# Patient Record
Sex: Male | Born: 1950 | Race: Black or African American | Hispanic: No | Smoking: Current every day smoker
Health system: Southern US, Community
[De-identification: ages and names within clinical notes are randomized; demographics above are authoritative.]

## PROBLEM LIST (undated history)

## (undated) DIAGNOSIS — I10 Essential (primary) hypertension: Secondary | ICD-10-CM

---

## 2016-10-25 ENCOUNTER — Emergency Department (HOSPITAL_COMMUNITY): Payer: Medicare PPO

## 2016-10-25 ENCOUNTER — Emergency Department (HOSPITAL_COMMUNITY)
Admission: EM | Admit: 2016-10-25 | Discharge: 2016-10-25 | Disposition: A | Discharge status: Home / Self Care | Payer: Medicare PPO | Attending: Emergency Medicine | Admitting: Emergency Medicine

## 2016-10-25 ENCOUNTER — Encounter (HOSPITAL_COMMUNITY): Payer: Self-pay

## 2016-10-25 DIAGNOSIS — F17210 Nicotine dependence, cigarettes, uncomplicated: Secondary | ICD-10-CM | POA: Diagnosis not present

## 2016-10-25 DIAGNOSIS — L03113 Cellulitis of right upper limb: Secondary | ICD-10-CM | POA: Diagnosis not present

## 2016-10-25 DIAGNOSIS — M7989 Other specified soft tissue disorders: Secondary | ICD-10-CM | POA: Diagnosis not present

## 2016-10-25 DIAGNOSIS — L03119 Cellulitis of unspecified part of limb: Secondary | ICD-10-CM

## 2016-10-25 DIAGNOSIS — M79641 Pain in right hand: Secondary | ICD-10-CM | POA: Diagnosis not present

## 2016-10-25 DIAGNOSIS — I10 Essential (primary) hypertension: Secondary | ICD-10-CM | POA: Insufficient documentation

## 2016-10-25 HISTORY — DX: Essential (primary) hypertension: I10

## 2016-10-25 MED ORDER — ONDANSETRON HCL 4 MG PO TABS
4.0000 mg | ORAL_TABLET | Freq: Once | ORAL | Status: AC
  Administered 2016-10-25: 4 mg via ORAL
  Filled 2016-10-25: qty 1

## 2016-10-25 MED ORDER — KETOROLAC TROMETHAMINE 10 MG PO TABS
10.0000 mg | ORAL_TABLET | Freq: Once | ORAL | Status: AC
  Administered 2016-10-25: 10 mg via ORAL
  Filled 2016-10-25: qty 1

## 2016-10-25 MED ORDER — AMOXICILLIN-POT CLAVULANATE 875-125 MG PO TABS
1.0000 | ORAL_TABLET | Freq: Once | ORAL | Status: AC
  Administered 2016-10-25: 1 via ORAL
  Filled 2016-10-25: qty 1

## 2016-10-25 MED ORDER — AMOXICILLIN-POT CLAVULANATE 875-125 MG PO TABS: 1.0000 | ORAL_TABLET | Freq: Two times a day (BID) | ORAL | 0 refills | Status: DC

## 2016-10-25 NOTE — Discharge Instructions (Signed)
Your blood pressure is elevated at 165/112, otherwise your vital signs are okay. Your x-ray is negative for broken bone, foreign body, or other acute problem. I suspect that you have an infection in your hand. Please use Augmentin 2 times daily with food. Use 600 mg of ibuprofen with breakfast, lunch, and dinner. Please return to the emergency department on January 15 for recheck of your hand.

## 2016-10-25 NOTE — ED Triage Notes (Signed)
Pt reports right hand started swelling across knuckles on Monday. Now entire hand is swollen. Complaining of pain. Unable to bend fingers

## 2016-10-26 NOTE — ED Provider Notes (Signed)
[facility] Provider Note   CSN: [id number] Arrival date & time: 10/25/16  1006     History   Chief Complaint Chief Complaint  Patient presents with  . Hand Pain    HPI Hector Nguyen is a 66 y.o. male.  Patient is a 66 year old male who presents to the emergency department with a complaint of hand pain and swelling.  The patient states that 2 days ago he was helping someone to clean out dose. He states that after doing so he went home, had a few beers, laid down on the couch and fell asleep. When he awakened he felt a pain and throbbing with some mild swelling of his right hand. He thought that perhaps he had been bitten by something. He applied a cool compress to the hand, but the following day the pain and swelling worse. He presents today because the problem seems to be getting worse instead of better. The hand is warm to touch and he is concerned about infection. He can use his right upper extremity, but has some stiffness and some pain with movement of the fingers and wrist. His been no fever or chills reported. No drainage from the hand. The patient clearly denies being in any recent fight or confrontation. He denies hitting anyone in the mouth, or having contact with his hand with any persons teeth.   The history is provided by the patient.  Hand Pain  This is a new problem. Pertinent negatives include no chest pain, no abdominal pain and no shortness of breath.    Past Medical History:  Diagnosis Date  . Hypertension     There are no active problems to display for this patient.   History reviewed. No pertinent surgical history.     Home Medications    Prior to Admission medications   Medication Sig Start Date End Date Taking? Authorizing Provider  ibuprofen (ADVIL,MOTRIN) 200 MG tablet Take 400 mg by mouth every 6 (six) hours as needed for moderate pain.   Yes Historical Provider, MD  amoxicillin-clavulanate (AUGMENTIN) 875-125 MG tablet Take 1 tablet  by mouth every 12 (twelve) hours. 10/25/16   Ivery Quale, PA-C    Family History No family history on file.  Social History Social History  Substance Use Topics  . Smoking status: Current Every Day Smoker    Packs/day: 0.50    Types: Cigarettes  . Smokeless tobacco: Never Used  . Alcohol use 3.6 oz/week    6 Cans of beer per week     Allergies   Patient has no known allergies.   Review of Systems Review of Systems  Constitutional: Negative for activity change.       All ROS Neg except as noted in HPI  HENT: Negative for nosebleeds.   Eyes: Negative for photophobia and discharge.  Respiratory: Negative for cough, shortness of breath and wheezing.   Cardiovascular: Negative for chest pain and palpitations.  Gastrointestinal: Negative for abdominal pain and blood in stool.  Genitourinary: Negative for dysuria, frequency and hematuria.  Musculoskeletal: Negative for arthralgias, back pain and neck pain.  Skin: Negative.   Neurological: Negative for dizziness, seizures and speech difficulty.  Psychiatric/Behavioral: Negative for confusion and hallucinations.     Physical Exam Updated Vital Signs BP (!) 168/104 (BP Location: Right Arm)   Pulse 76   Temp 98.1 F (36.7 C) (Oral)   Resp 16   Ht 6\' 3"  (1.905 m)   Wt 90.7 kg   SpO2 100%  BMI 25.00 kg/m   Physical Exam  Constitutional: He is oriented to person, place, and time. He appears well-developed and well-nourished.  Non-toxic appearance.  HENT:  Head: Normocephalic.  Right Ear: Tympanic membrane and external ear normal.  Left Ear: Tympanic membrane and external ear normal.  Eyes: EOM and lids are normal. Pupils are equal, round, and reactive to light.  Neck: Normal range of motion. Neck supple. Carotid bruit is not present.  Cardiovascular: Normal rate, regular rhythm, normal heart sounds, intact distal pulses and normal pulses.   Pulmonary/Chest: Breath sounds normal. No respiratory distress.  Abdominal:  Soft. Bowel sounds are normal. There is no tenderness. There is no guarding.  Musculoskeletal: Normal range of motion.  There is full range of motion of the right shoulder and elbow. There is good flexion and pronation with the right wrist. There is swelling of the dorsum of the right hand. There is a scab area just behind the MP joint of the long finger. There is good range of motion of the fingers, but with some stiffness present. Capillary refill is less than 2 seconds. Dorsalis pedis pulses 2+.  Lymphadenopathy:       Head (right side): No submandibular adenopathy present.       Head (left side): No submandibular adenopathy present.    He has no cervical adenopathy.  Neurological: He is alert and oriented to person, place, and time. He has normal strength. No cranial nerve deficit or sensory deficit.  Skin: Skin is warm and dry.  Psychiatric: He has a normal mood and affect. His speech is normal.  Nursing note and vitals reviewed.    ED Treatments / Results  Labs (all labs ordered are listed, but only abnormal results are displayed) Labs Reviewed - No data to display  EKG  EKG Interpretation None       Radiology Dg Hand Complete Right  Result Date: 10/25/2016 CLINICAL DATA:  PAIN AND SWELLING TO RIGHT HAND, EVAL FOR FB OR GAS AT DORSUM OF THE HAND, PT STATES HE WAS HELPING SOMEONE CLEAN OUT A HOUSE ON Monday, WENT HOME, HAD A FEW BEERS, LAID DOWN ON THE COUCH AND FELL ASLEEP AND WAS WOKEN UP BY PAIN, THROBBING, AND SWELLING IN HIS RIGHT HAND, NO KNOWN SPECIFIC INJURY OR BITE EXAM: RIGHT HAND - COMPLETE 3+ VIEW COMPARISON:  None. FINDINGS: No fracture.  No bone lesion. There is asymmetric joint space narrowing of the third metacarpal phalangeal joint with small marginal spurs. Remaining joints are normally spaced and aligned. There is dorsal soft tissue swelling. No radiopaque foreign body. No soft tissue air. IMPRESSION: 1. No fracture or dislocation.  No bone lesion. 2. Dorsal soft  tissue swelling with no radiopaque foreign body or soft tissue air. 3. Arthropathic changes of the third metacarpal phalangeal joint. Electronically Signed   By: Amie Portland M.D.   On: 10/25/2016 12:13    Procedures Procedures (including critical care time)  Medications Ordered in ED Medications  amoxicillin-clavulanate (AUGMENTIN) 875-125 MG per tablet 1 tablet (1 tablet Oral Given 10/25/16 1304)  ketorolac (TORADOL) tablet 10 mg (10 mg Oral Given 10/25/16 1304)  ondansetron (ZOFRAN) tablet 4 mg (4 mg Oral Given 10/25/16 1304)     Initial Impression / Assessment and Plan / ED Course  I have reviewed the triage vital signs and the nursing notes.  Pertinent labs & imaging results that were available during my care of the patient were reviewed by me and considered in my medical decision making (see chart  for details).  Clinical Course     **I have reviewed nursing notes, vital signs, and all appropriate lab and imaging results for this patient.*  Final Clinical Impressions(s) / ED Diagnoses  Pressure is elevated at 168/104. The remainder the vital signs within normal limits. There no red streaks going up the arm. There is no drainage from the hand. There is full range of motion of the entire right upper extremity. X-ray of the right hand shows no fracture or dislocation. There is no radiopaque foreign body appreciated. There is noted soft tissue swelling of the dorsum of the hand.  The patient will be treated with Augmentin and anti-inflammatory medication. The patient is to return to the emergency department in 3 days for recheck of his hand. Sooner if any acute or emergent changes or problems.     Final diagnoses:  Cellulitis of hand    New Prescriptions Discharge Medication List as of 10/25/2016  1:13 PM    START taking these medications   Details  amoxicillin-clavulanate (AUGMENTIN) 875-125 MG tablet Take 1 tablet by mouth every 12 (twelve) hours., Starting Fri 10/25/2016,  Print         [place] Michaelene Dutan, PA-C 10/26/16 1603    Bethann BerkshireJoseph Zammit, MD 11/04/16 [phone]

## 2016-11-06 ENCOUNTER — Encounter (HOSPITAL_COMMUNITY): Payer: Self-pay | Admitting: Emergency Medicine

## 2016-11-06 ENCOUNTER — Emergency Department (HOSPITAL_COMMUNITY)
Admission: EM | Admit: 2016-11-06 | Discharge: 2016-11-06 | Disposition: A | Discharge status: Home / Self Care | Payer: Medicare PPO | Attending: Emergency Medicine | Admitting: Emergency Medicine

## 2016-11-06 ENCOUNTER — Emergency Department (HOSPITAL_COMMUNITY): Payer: Medicare PPO

## 2016-11-06 DIAGNOSIS — F17210 Nicotine dependence, cigarettes, uncomplicated: Secondary | ICD-10-CM | POA: Diagnosis not present

## 2016-11-06 DIAGNOSIS — Z79899 Other long term (current) drug therapy: Secondary | ICD-10-CM | POA: Insufficient documentation

## 2016-11-06 DIAGNOSIS — I10 Essential (primary) hypertension: Secondary | ICD-10-CM | POA: Diagnosis not present

## 2016-11-06 DIAGNOSIS — M7989 Other specified soft tissue disorders: Secondary | ICD-10-CM | POA: Diagnosis not present

## 2016-11-06 DIAGNOSIS — R2231 Localized swelling, mass and lump, right upper limb: Secondary | ICD-10-CM | POA: Diagnosis not present

## 2016-11-06 LAB — COMPREHENSIVE METABOLIC PANEL
ALBUMIN: 3.7 g/dL (ref 3.5–5.0)
ALT: 12 U/L — ABNORMAL LOW (ref 17–63)
AST: 13 U/L — AB (ref 15–41)
Alkaline Phosphatase: 44 U/L (ref 38–126)
Anion gap: 7 (ref 5–15)
BUN: 11 mg/dL (ref 6–20)
CO2: 25 mmol/L (ref 22–32)
CREATININE: 0.9 mg/dL (ref 0.61–1.24)
Calcium: 8.9 mg/dL (ref 8.9–10.3)
Chloride: 107 mmol/L (ref 101–111)
GFR calc Af Amer: >60 mL/min (ref >60)
GLUCOSE: 123 mg/dL — AB (ref 65–99)
POTASSIUM: 3.4 mmol/L — AB (ref 3.5–5.1)
Sodium: 139 mmol/L (ref 135–145)
TOTAL PROTEIN: 7.1 g/dL (ref 6.5–8.1)
Total Bilirubin: 0.5 mg/dL (ref 0.3–1.2)

## 2016-11-06 LAB — CBC WITH DIFFERENTIAL/PLATELET
BASOS ABS: 0.1 10*3/uL (ref 0.0–0.1)
Basophils Relative: 1 %
Eosinophils Absolute: 0.1 10*3/uL (ref 0.0–0.7)
Eosinophils Relative: 1 %
HCT: 38.8 % — ABNORMAL LOW (ref 39.0–52.0)
HEMOGLOBIN: 13.2 g/dL (ref 13.0–17.0)
LYMPHS PCT: 28 %
Lymphs Abs: 1.8 10*3/uL (ref 0.7–4.0)
MCH: 29.1 pg (ref 26.0–34.0)
MCHC: 34 g/dL (ref 30.0–36.0)
MCV: 85.5 fL (ref 78.0–100.0)
MONO ABS: 0.5 10*3/uL (ref 0.1–1.0)
MONOS PCT: 8 %
NEUTROS ABS: 4.1 10*3/uL (ref 1.7–7.7)
NEUTROS PCT: 62 %
Platelets: 410 10*3/uL — ABNORMAL HIGH (ref 150–400)
RBC: 4.54 MIL/uL (ref 4.22–5.81)
RDW: 14.2 % (ref 11.5–15.5)
WBC: 6.6 10*3/uL (ref 4.0–10.5)

## 2016-11-06 MED ORDER — HYDROCODONE-ACETAMINOPHEN 5-325 MG PO TABS
1.0000 | ORAL_TABLET | Freq: Once | ORAL | Status: AC
  Administered 2016-11-06: 1 via ORAL
  Filled 2016-11-06: qty 1

## 2016-11-06 MED ORDER — ACETAMINOPHEN 325 MG PO TABS
650.0000 mg | ORAL_TABLET | Freq: Once | ORAL | Status: AC
  Administered 2016-11-06: 650 mg via ORAL
  Filled 2016-11-06: qty 2

## 2016-11-06 NOTE — ED Triage Notes (Signed)
PT c/o right hand pain/swelling unrelieved by ibuprofen and antibiotics that were started on 10/25/16. PT denies any new injuries or fevers.

## 2016-11-06 NOTE — ED Provider Notes (Signed)
[facility] Provider Note   CSN: [id number] Arrival date & time: 11/06/16  8295   By signing my name below, I, Cynda Acres, attest that this documentation has been prepared under the direction and in the presence of Azalia Bilis, MD. Electronically Signed: Cynda Acres, Scribe. 11/06/16. 9:57 AM.   History   Chief Complaint Chief Complaint  Patient presents with  . Cellulitis    HPI Comments: Hector Nguyen is a 66 y.o. male with a hx of HTN, who presents to the Emergency Department complaining of right hand pain that began 2 weeks ago. Patient was here one week ago, in which he was prescribed Augmentin. Patient has associated swelling and elbow pain (no longer present). Patient states he has had gout in the past. Patient has had no improvement with Augmentin and advil (last pill taken today). Patient denies any fevers, injury, elevation, or recent blood clots.   The history is provided by the patient. No language interpreter was used.    Past Medical History:  Diagnosis Date  . Hypertension     There are no active problems to display for this patient.   History reviewed. No pertinent surgical history.     Home Medications    Prior to Admission medications   Medication Sig Start Date End Date Taking? Authorizing Provider  amoxicillin-clavulanate (AUGMENTIN) 875-125 MG tablet Take 1 tablet by mouth every 12 (twelve) hours. 10/25/16   Ivery Quale, PA-C  ibuprofen (ADVIL,MOTRIN) 200 MG tablet Take 400 mg by mouth every 6 (six) hours as needed for moderate pain.    Historical Provider, MD    Family History History reviewed. No pertinent family history.  Social History Social History  Substance Use Topics  . Smoking status: Current Every Day Smoker    Packs/day: 0.50    Types: Cigarettes  . Smokeless tobacco: Never Used  . Alcohol use 3.6 oz/week    6 Cans of beer per week     Allergies   Patient has no known allergies.   Review of  Systems Review of Systems A complete 10 system review of systems was obtained and all systems are negative except as noted in the HPI and PMH.    Physical Exam Updated Vital Signs BP (!) 183/106 (BP Location: Left Arm)   Pulse 70   Temp 98.5 F (36.9 C) (Oral)   Resp 18   Ht 6\' 3"  (1.905 m)   Wt 200 lb (90.7 kg)   SpO2 100%   BMI 25.00 kg/m   Physical Exam  Constitutional: He is oriented to person, place, and time. He appears well-developed and well-nourished.  HENT:  Head: Normocephalic.  Eyes: EOM are normal.  Neck: Normal range of motion.  Pulmonary/Chest: Effort normal.  Abdominal: He exhibits no distension.  Musculoskeletal:  Swelling to the dorsum the right hand.  Normal flexion and extension of all 5 fingers on the right hand.  Normal right radial pulse.  Compartments soft.  No erythema or warmth of the dorsum of the right hand.  No obvious injury or trauma pattern noted.  Normal range of motion of the right elbow and right shoulder.  No significant tenderness in the right medial upper arm.  Neurological: He is alert and oriented to person, place, and time.  Psychiatric: He has a normal mood and affect.  Nursing note and vitals reviewed.    ED Treatments / Results  DIAGNOSTIC STUDIES: Oxygen Saturation is 100% on RA, normal by my interpretation.    COORDINATION OF  CARE: 9:56 AM Discussed treatment plan with pt at bedside and pt agreed to plan.  Labs (all labs ordered are listed, but only abnormal results are displayed) Labs Reviewed  CBC WITH DIFFERENTIAL/PLATELET - Abnormal; Notable for the following:       Result Value   HCT 38.8 (*)    Platelets 410 (*)    All other components within normal limits  COMPREHENSIVE METABOLIC PANEL - Abnormal; Notable for the following:    Potassium 3.4 (*)    Glucose, Bld 123 (*)    AST 13 (*)    ALT 12 (*)    All other components within normal limits    EKG  EKG Interpretation None       Radiology [place] Venous Img  Upper Uni Right  Result Date: 11/06/2016 CLINICAL DATA:  Right hand swelling. EXAM: RIGHT UPPER EXTREMITY VENOUS DOPPLER ULTRASOUND TECHNIQUE: Gray-scale sonography with graded compression, as well as color Doppler and duplex ultrasound were performed to evaluate the upper extremity deep venous system from the level of the subclavian vein and including the jugular, axillary, basilic, radial, ulnar and upper cephalic vein. Spectral Doppler was utilized to evaluate flow at rest and with distal augmentation maneuvers. COMPARISON:  None. FINDINGS: Contralateral Subclavian Vein: Normal compression, color Doppler flow and phasicity in the left subclavian vein. Internal Jugular Vein: No thrombus. Normal compressibility, color Doppler flow and phasicity. Subclavian Vein: Normal compressibility, color Doppler flow and phasicity. Axillary Vein: No thrombus. Normal compressibility, color Doppler flow and augmentation. Cephalic Vein: No thrombus. Normal compressibility and color Doppler flow. Basilic Vein: No thrombus. Normal compressibility, color Doppler flow and augmentation. Brachial Veins: No thrombus. Normal compressibility, color Doppler flow and augmentation in the brachial veins. Radial Veins: No thrombus. Normal compressibility and color Doppler flow. Ulnar Veins: No thrombus. Normal compressibility and color Doppler flow. IMPRESSION: No evidence of deep venous thrombosis in the right upper extremity. Electronically Signed   By: Richarda OverlieAdam  Henn M.D.   On: 11/06/2016 11:26    Procedures Procedures (including critical care time)  Medications Ordered in ED Medications - No data to display   Initial Impression / Assessment and Plan / ED Course  I have reviewed the triage vital signs and the nursing notes.  Pertinent labs & imaging results that were available during my care of the patient were reviewed by me and considered in my medical decision making (see chart for details).     Patient has a history of  gout and this may represent gouty arthritis.  He does have some pain with range of motion of his MCP joints.  There is no obvious erythema or warmth.  I'm not convinced this is infection.  I think he's had his hand down at his side the majority of the time and this is definitely not helping the swelling.  Venous duplex demonstrates no evidence of DVT.  Recommended elevation as well as primary care and orthopedic follow-up.  I've put him in a sling in that right arm with his hand elevated above the level of his heart to see if this helps with swelling.  Final Clinical Impressions(s) / ED Diagnoses   Final diagnoses:  Swelling of right hand    New Prescriptions New Prescriptions   No medications on file   I personally performed the services described in this documentation, which was scribed in my presence. The recorded information has been reviewed and is accurate.        Azalia BilisKevin Mikaili Flippin, MD 11/06/16 [phone]

## 2016-11-06 NOTE — ED Notes (Signed)
Pt back from US

## 2016-11-06 NOTE — Discharge Instructions (Signed)
Elevate your hand.   Please call your primary care physician and the orthopedist for follow up  Return to ER for new or worsening symptoms

## 2016-12-26 DIAGNOSIS — F1020 Alcohol dependence, uncomplicated: Secondary | ICD-10-CM | POA: Diagnosis not present

## 2016-12-26 DIAGNOSIS — I10 Essential (primary) hypertension: Secondary | ICD-10-CM | POA: Diagnosis not present

## 2017-01-17 DIAGNOSIS — M1000 Idiopathic gout, unspecified site: Secondary | ICD-10-CM | POA: Diagnosis not present

## 2017-01-17 DIAGNOSIS — I10 Essential (primary) hypertension: Secondary | ICD-10-CM | POA: Diagnosis not present

## 2017-01-17 DIAGNOSIS — F1020 Alcohol dependence, uncomplicated: Secondary | ICD-10-CM | POA: Diagnosis not present

## 2017-01-17 DIAGNOSIS — Z6823 Body mass index (BMI) 23.0-23.9, adult: Secondary | ICD-10-CM | POA: Diagnosis not present

## 2017-01-17 DIAGNOSIS — D649 Anemia, unspecified: Secondary | ICD-10-CM | POA: Diagnosis not present

## 2017-02-18 DIAGNOSIS — F1020 Alcohol dependence, uncomplicated: Secondary | ICD-10-CM | POA: Diagnosis not present

## 2017-02-18 DIAGNOSIS — M1000 Idiopathic gout, unspecified site: Secondary | ICD-10-CM | POA: Diagnosis not present

## 2017-02-18 DIAGNOSIS — I10 Essential (primary) hypertension: Secondary | ICD-10-CM | POA: Diagnosis not present

## 2017-02-18 DIAGNOSIS — D649 Anemia, unspecified: Secondary | ICD-10-CM | POA: Diagnosis not present

## 2017-03-25 DIAGNOSIS — D644 Congenital dyserythropoietic anemia: Secondary | ICD-10-CM | POA: Diagnosis not present

## 2017-03-25 DIAGNOSIS — D649 Anemia, unspecified: Secondary | ICD-10-CM | POA: Diagnosis not present

## 2017-03-25 DIAGNOSIS — I10 Essential (primary) hypertension: Secondary | ICD-10-CM | POA: Diagnosis not present

## 2017-03-25 DIAGNOSIS — E785 Hyperlipidemia, unspecified: Secondary | ICD-10-CM | POA: Diagnosis not present

## 2017-03-25 DIAGNOSIS — M1000 Idiopathic gout, unspecified site: Secondary | ICD-10-CM | POA: Diagnosis not present

## 2017-03-25 DIAGNOSIS — F1020 Alcohol dependence, uncomplicated: Secondary | ICD-10-CM | POA: Diagnosis not present

## 2017-03-30 IMAGING — US US EXTREM  UP VENOUS*R*
1 series · 13 of 24 positions shown · non-contrast
Comparison: None.

CLINICAL DATA: Right hand swelling.



[Series 1: us extrem up venous*right* · 0.08mm/px · 13 of 63 slices shown]
[im 1/63]
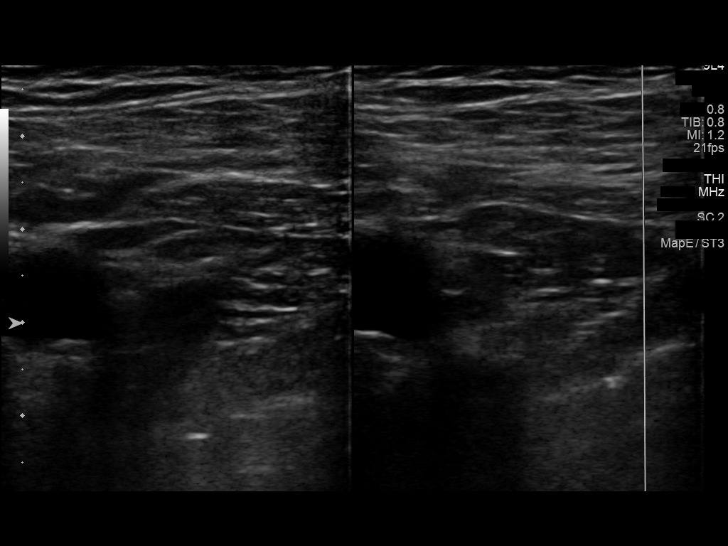
[im 6/63]
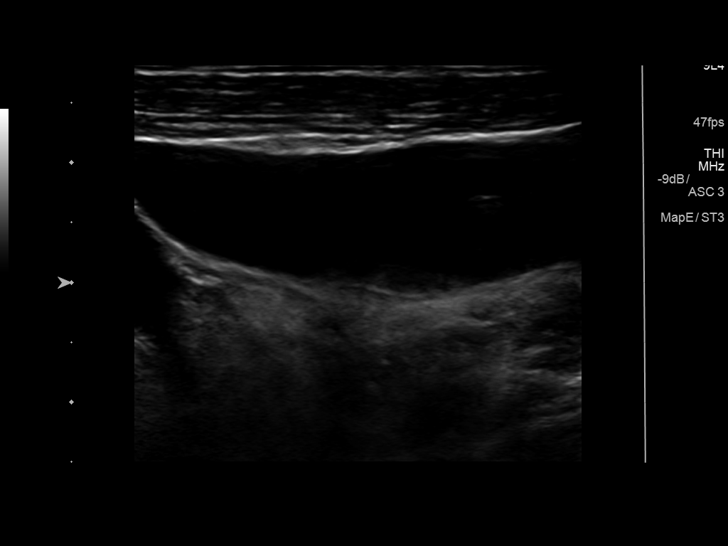
[im 11/63]
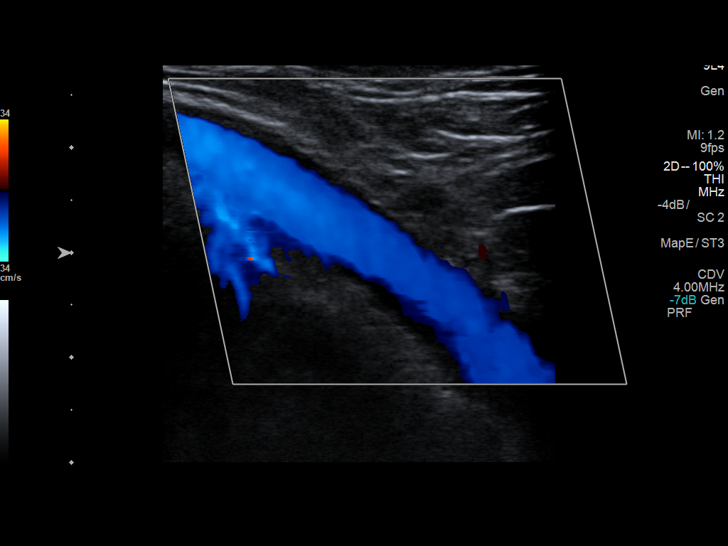
[im 17/63]
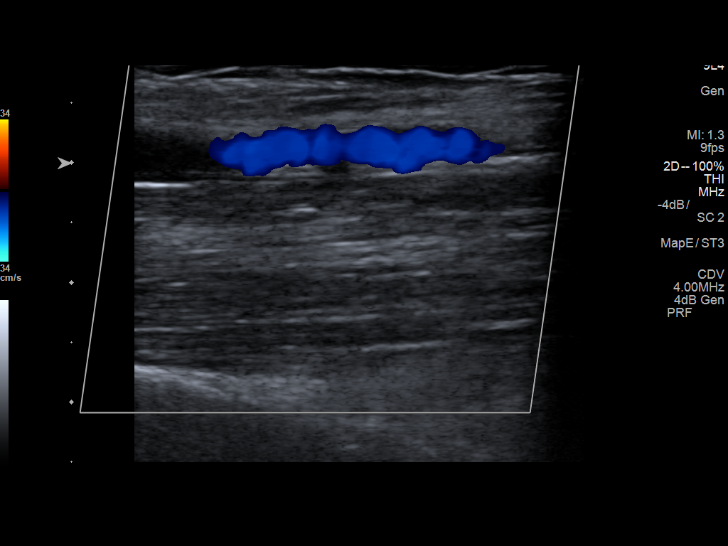
[im 22/63]
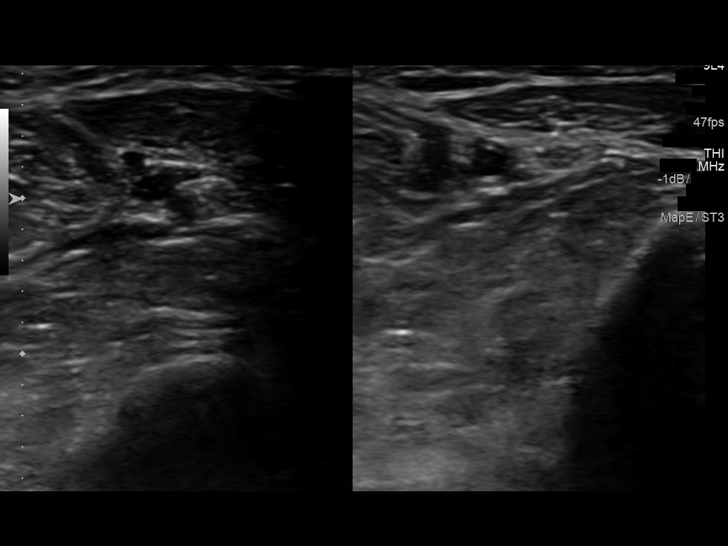
[im 27/63]
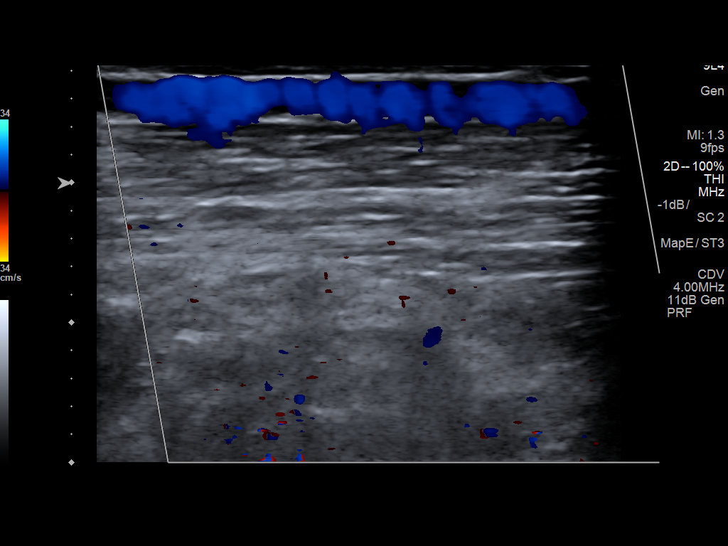
[im 33/63]
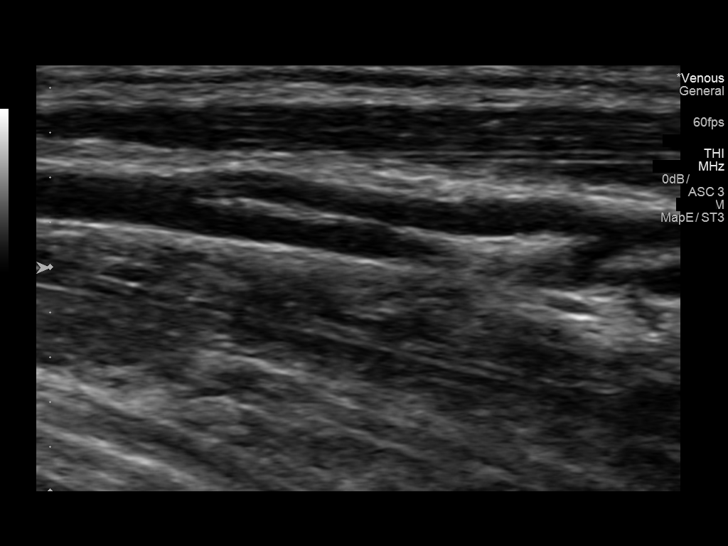
[im 36/63]
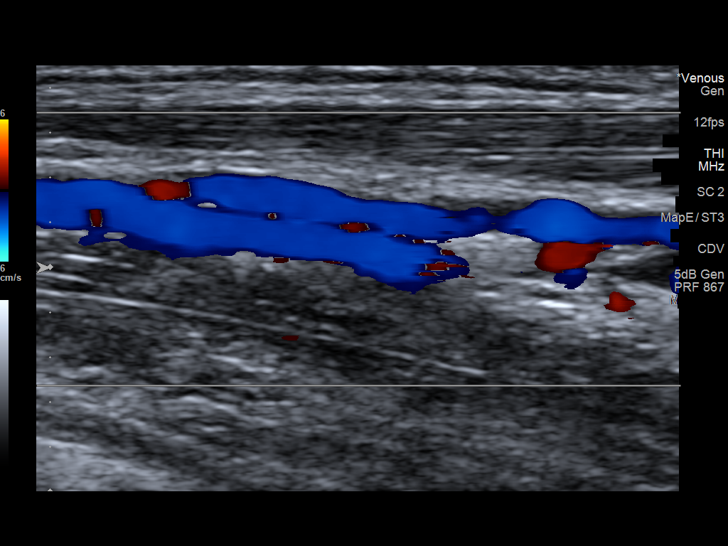
[im 41/63]
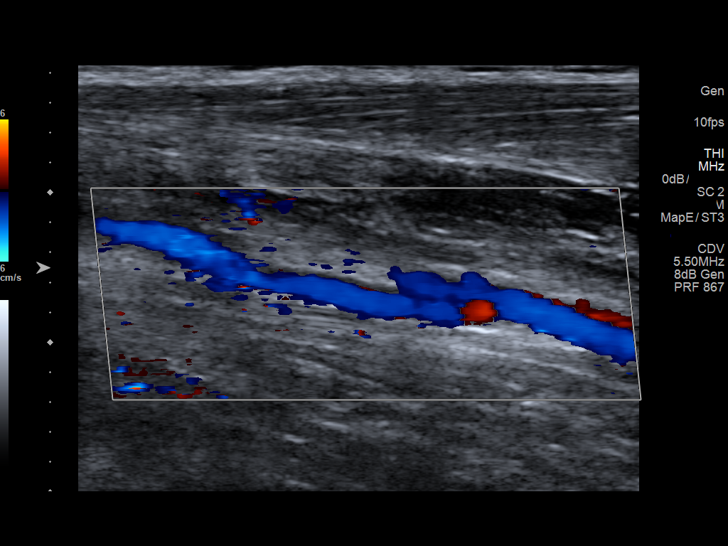
[im 46/63]
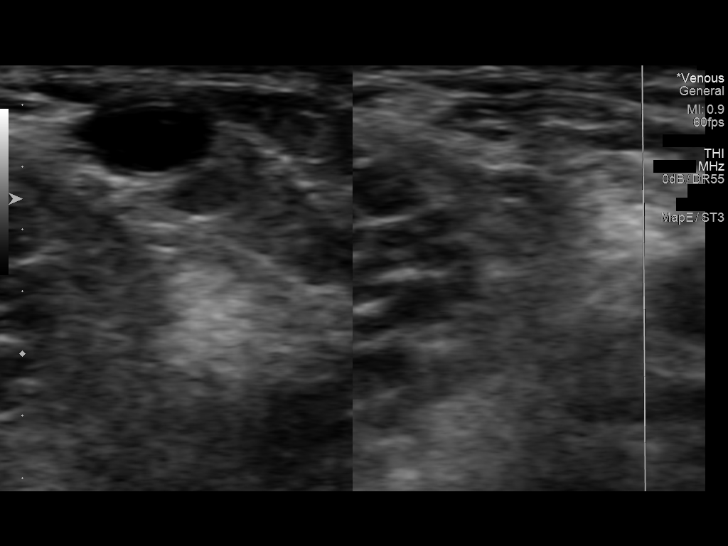
[im 52/63]
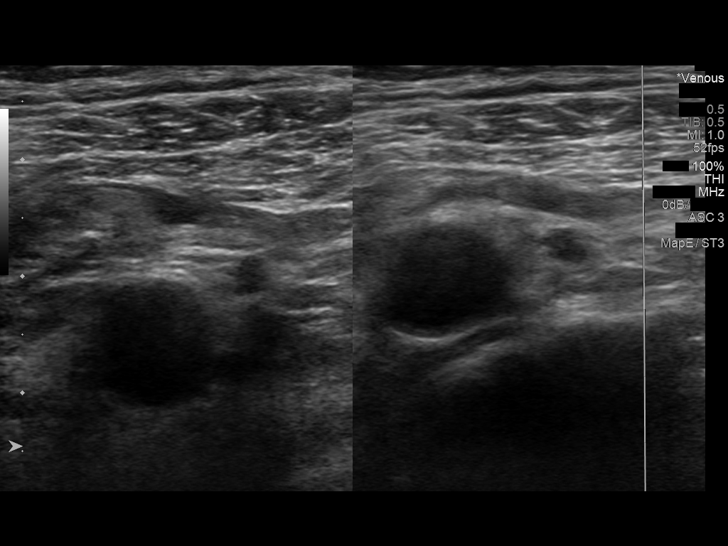
[im 57/63]
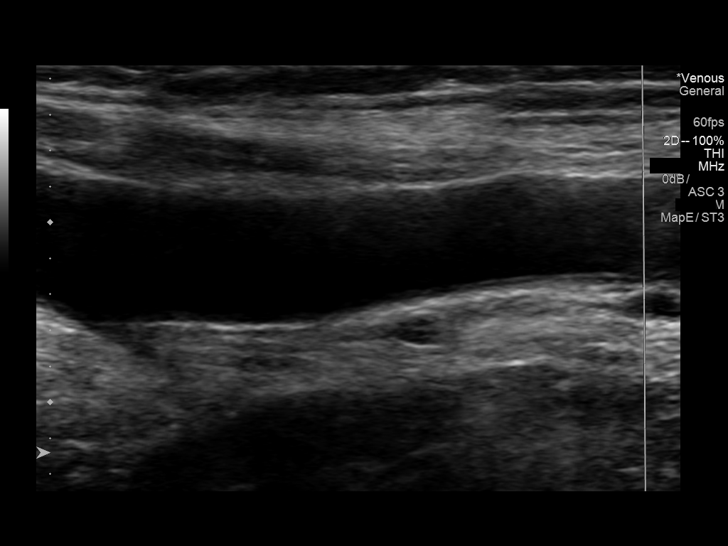
[im 63/63]
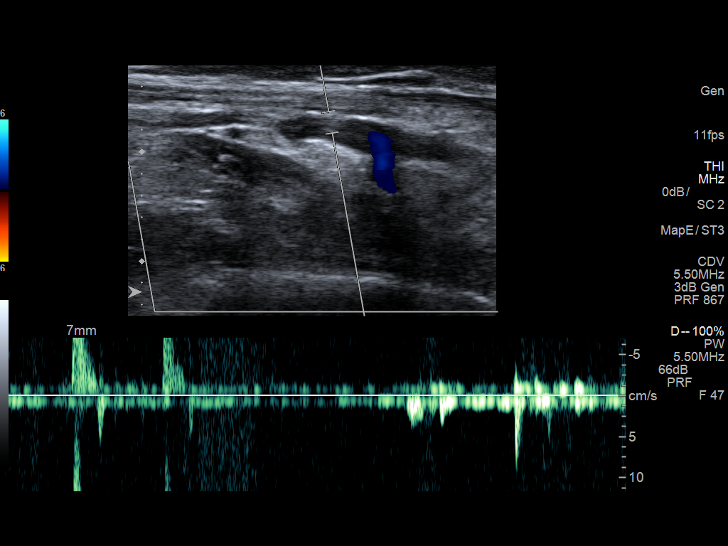

[13 of 24 positions shown; findings below may reference images not displayed]

FINDINGS: Contralateral Subclavian Vein: Normal compression, color Doppler
flow and phasicity in the left subclavian vein.

Internal Jugular Vein: No thrombus. Normal compressibility, color
Doppler flow and phasicity.

Subclavian Vein: Normal compressibility, color Doppler flow and
phasicity.

Axillary Vein: No thrombus. Normal compressibility, color Doppler
flow and augmentation.

Cephalic Vein: No thrombus. Normal compressibility and color Doppler
flow.

Basilic Vein: No thrombus. Normal compressibility, color Doppler
flow and augmentation.

Brachial Veins: No thrombus. Normal compressibility, color Doppler
flow and augmentation in the brachial veins.

Radial Veins: No thrombus. Normal compressibility and color Doppler
flow.

Ulnar Veins: No thrombus. Normal compressibility and color Doppler
flow.
IMPRESSION: No evidence of deep venous thrombosis in the right upper extremity.

## 2017-06-11 DIAGNOSIS — M1000 Idiopathic gout, unspecified site: Secondary | ICD-10-CM | POA: Diagnosis not present

## 2017-06-11 DIAGNOSIS — F1020 Alcohol dependence, uncomplicated: Secondary | ICD-10-CM | POA: Diagnosis not present

## 2017-06-11 DIAGNOSIS — Z0000 Encounter for general adult medical examination without abnormal findings: Secondary | ICD-10-CM | POA: Diagnosis not present

## 2017-06-11 DIAGNOSIS — D649 Anemia, unspecified: Secondary | ICD-10-CM | POA: Diagnosis not present

## 2017-06-11 DIAGNOSIS — I10 Essential (primary) hypertension: Secondary | ICD-10-CM | POA: Diagnosis not present

## 2017-06-17 ENCOUNTER — Other Ambulatory Visit: Payer: Self-pay | Admitting: Pharmacy Technician

## 2017-06-17 NOTE — Patient Outreach (Signed)
[facility] Care Management  06/17/2017  Hector QualeRobert A Nguyen 05/01/1951 [id number]  Contacted [facility] to verify last fill for Losartan/HCTZ 100-12.5mg  medication adherence. [facility] that patient last filled script on 06/11/17 for 30 day supply.  Suzan SlickAshley N. Ernesta Ambleoleman, CPhT [facility] Care Management [phone]

## 2017-07-25 DIAGNOSIS — M1000 Idiopathic gout, unspecified site: Secondary | ICD-10-CM | POA: Diagnosis not present

## 2017-07-25 DIAGNOSIS — F1020 Alcohol dependence, uncomplicated: Secondary | ICD-10-CM | POA: Diagnosis not present

## 2017-07-25 DIAGNOSIS — I10 Essential (primary) hypertension: Secondary | ICD-10-CM | POA: Diagnosis not present

## 2017-07-25 DIAGNOSIS — D649 Anemia, unspecified: Secondary | ICD-10-CM | POA: Diagnosis not present

## 2017-10-24 DIAGNOSIS — M1000 Idiopathic gout, unspecified site: Secondary | ICD-10-CM | POA: Diagnosis not present

## 2017-10-24 DIAGNOSIS — D649 Anemia, unspecified: Secondary | ICD-10-CM | POA: Diagnosis not present

## 2017-10-24 DIAGNOSIS — F1020 Alcohol dependence, uncomplicated: Secondary | ICD-10-CM | POA: Diagnosis not present

## 2017-10-24 DIAGNOSIS — I10 Essential (primary) hypertension: Secondary | ICD-10-CM | POA: Diagnosis not present

## 2023-10-16 ENCOUNTER — Other Ambulatory Visit: Payer: Self-pay

## 2023-10-16 ENCOUNTER — Emergency Department (HOSPITAL_COMMUNITY)
Admission: EM | Admit: 2023-10-16 | Discharge: 2023-10-17 | Disposition: A | Discharge status: Home / Self Care | Payer: Medicare Other | Attending: Emergency Medicine | Admitting: Emergency Medicine

## 2023-10-16 ENCOUNTER — Encounter (HOSPITAL_COMMUNITY): Payer: Self-pay

## 2023-10-16 DIAGNOSIS — I10 Essential (primary) hypertension: Secondary | ICD-10-CM | POA: Insufficient documentation

## 2023-10-16 DIAGNOSIS — S0990XA Unspecified injury of head, initial encounter: Secondary | ICD-10-CM | POA: Diagnosis present

## 2023-10-16 DIAGNOSIS — S01111A Laceration without foreign body of right eyelid and periocular area, initial encounter: Secondary | ICD-10-CM | POA: Diagnosis not present

## 2023-10-16 DIAGNOSIS — S0083XA Contusion of other part of head, initial encounter: Secondary | ICD-10-CM

## 2023-10-16 DIAGNOSIS — W010XXA Fall on same level from slipping, tripping and stumbling without subsequent striking against object, initial encounter: Secondary | ICD-10-CM | POA: Diagnosis not present

## 2023-10-16 NOTE — ED Triage Notes (Signed)
 Pt arrived POV with family for fall after stumbling over an object. Pt fell face forward from standing position and hit his face on hardwood floors. No LOC, not on thinners, on takes BP meds. Pt admits to ETOH tonight, drank half a pint of liquor. Pt has hematoma to left side fore head above left eyebrow, and laceration above the right eyebrow. Pt currently A&O x4, HTN noted, otherwise VSS.

## 2023-10-17 ENCOUNTER — Emergency Department (HOSPITAL_COMMUNITY): Payer: Medicare Other

## 2023-10-17 NOTE — ED Provider Notes (Signed)
 [place] [facility] AT [facility] Provider Note   CSN: [id number] Arrival date & time: 10/16/23  2316     History  Chief Complaint  Patient presents with   Hector Nguyen is a 73 y.o. male.  Patient presents to the emergency department for evaluation after a fall.  Patient tripped over an object and fell to the ground, hitting his head on the hardwood floor.  No loss of consciousness.       Home Medications Prior to Admission medications   Medication Sig Start Date End Date Taking? Authorizing Provider  ibuprofen (ADVIL,MOTRIN) 200 MG tablet Take 200-400 mg by mouth every 6 (six) hours as needed for moderate pain.     [provider]      Allergies    Patient has no known allergies.    Review of Systems   Review of Systems  Physical Exam Updated Vital Signs BP (!) 173/92 (BP Location: Right Arm)   Pulse 82   Temp 98.1 F (36.7 C) (Oral)   Resp 20   Ht 6' 3 (1.905 m)   Wt 81.6 kg   SpO2 100%   BMI 22.50 kg/m  Physical Exam Vitals and nursing note reviewed.  Constitutional:      General: He is not in acute distress.    Appearance: He is well-developed.  HENT:     Head: Normocephalic and atraumatic.      Mouth/Throat:     Mouth: Mucous membranes are moist.  Eyes:     General: Vision grossly intact. Gaze aligned appropriately.     Extraocular Movements: Extraocular movements intact.     Conjunctiva/sclera: Conjunctivae normal.     Right eye: Right conjunctiva is not injected. No hemorrhage. Cardiovascular:     Rate and Rhythm: Normal rate and regular rhythm.     Pulses: Normal pulses.     Heart sounds: Normal heart sounds, S1 normal and S2 normal. No murmur heard.    No friction rub. No gallop.  Pulmonary:     Effort: Pulmonary effort is normal. No respiratory distress.     Breath sounds: Normal breath sounds.  Abdominal:     Palpations: Abdomen is soft.     Tenderness: There is no abdominal tenderness.  There is no guarding or rebound.     Hernia: No hernia is present.  Musculoskeletal:        General: No swelling.     Cervical back: Full passive range of motion without pain, normal range of motion and neck supple. No pain with movement, spinous process tenderness or muscular tenderness. Normal range of motion.     Right lower leg: No edema.     Left lower leg: No edema.  Skin:    General: Skin is warm and dry.     Capillary Refill: Capillary refill takes less than 2 seconds.     Findings: No ecchymosis, erythema, lesion or wound.  Neurological:     Mental Status: He is alert and oriented to person, place, and time.     GCS: GCS eye subscore is 4. GCS verbal subscore is 5. GCS motor subscore is 6.     Cranial Nerves: Cranial nerves 2-12 are intact.     Sensory: Sensation is intact.     Motor: Motor function is intact. No weakness or abnormal muscle tone.     Coordination: Coordination is intact.  Psychiatric:        Mood and Affect: Mood normal.  Speech: Speech normal.        Behavior: Behavior normal.     ED Results / Procedures / Treatments   Labs (all labs ordered are listed, but only abnormal results are displayed) Labs Reviewed - No data to display  EKG None  Radiology CT HEAD WO CONTRAST ( ) Result Date: 10/17/2023 CLINICAL DATA:  Neck trauma (Age >= 65y); Head trauma, minor (Age >= 65y); Facial trauma, blunt Pt arrived POV with family for fall after stumbling over an object. Pt fell face forward from standing position and hit his face on hardwood floors. No LOC, EXAM: CT HEAD WITHOUT CONTRAST CT MAXILLOFACIAL WITHOUT CONTRAST CT CERVICAL SPINE WITHOUT CONTRAST TECHNIQUE: Multidetector CT imaging of the head, cervical spine, and maxillofacial structures were performed using the standard protocol without intravenous contrast. Multiplanar CT image reconstructions of the cervical spine and maxillofacial structures were also generated. RADIATION DOSE REDUCTION: This exam  was performed according to the departmental dose-optimization program which includes automated exposure control, adjustment of the mA and/or kV according to patient size and/or use of iterative reconstruction technique. COMPARISON:  None Available. FINDINGS: CT HEAD FINDINGS Brain: Patchy and confluent areas of decreased attenuation are noted throughout the deep and periventricular white matter of the cerebral hemispheres bilaterally, compatible with chronic microvascular ischemic disease. Right frontal encephalomalacia. No evidence of large-territorial acute infarction. No parenchymal hemorrhage. No mass lesion. No extra-axial collection. No mass effect or midline shift. No hydrocephalus. Basilar cisterns are patent. Vascular: No hyperdense vessel. Skull: No acute fracture or focal lesion. Other: Left frontal 7 mm hematoma. CT MAXILLOFACIAL FINDINGS Osseous: No fracture or mandibular dislocation. No destructive process. Patient is edentulous. Sinuses/Orbits: Almost complete opacification of left maxillary sinus with hypertrophy of the sinus walls consistent with chronic sinusitis. Question polypoid extension of the maxillary sinus mucosal thickening along the left nasal cavity. Polypoid like mucosal thickening of the right sphenoid and ethmoid sinuses. Paranasal sinuses and mastoid air cells are clear. The orbits are unremarkable. Soft tissues: Negative. CT CERVICAL SPINE FINDINGS Alignment: Normal. Skull base and vertebrae: Multilevel severe degenerative changes of the spine. Associated multilevel moderate to severe osseous neural foraminal stenosis. No severe osseous central canal stenosis. No acute fracture. No aggressive appearing focal osseous lesion or focal pathologic process. Soft tissues and spinal canal: No prevertebral fluid or swelling. No visible canal hematoma. Upper chest: 1.4 x 0.8 cm left apical pulmonary nodule. Associated thin walled cystic lesion. Other: Subacute to chronic right posterior  second rib fracture. Atherosclerotic plaque of the carotid arteries within the neck. IMPRESSION: 1. No acute intracranial abnormality. 2.  No acute displaced facial fracture. 3. No acute displaced fracture or traumatic listhesis of the cervical spine. 4. Chronic left maxillary sinusitis with query polypoid-like extension into the left nasal cavity-correlate with direct visualization. 5. A 1.4 x 0.8 cm left apical pulmonary nodule. Associated thin walled cystic lesion. Consider one of the following in 3 months for both low-risk and high-risk individuals: (a) repeat chest CT, (b) follow-up PET-CT, or (c) tissue sampling. This recommendation follows the consensus statement: Guidelines for Management of Incidental Pulmonary Nodules Detected on CT Images: From the Fleischner Society 2017; Radiology 2017; 284:228-243. Electronically Signed   By: Morgane  Naveau M.D.   On: 10/17/2023 01:03   CT CERVICAL SPINE WO CONTRAST Result Date: 10/17/2023 CLINICAL DATA:  Neck trauma (Age >= 65y); Head trauma, minor (Age >= 65y); Facial trauma, blunt Pt arrived POV with family for fall after stumbling over an object. Pt fell face forward from  standing position and hit his face on hardwood floors. No LOC, EXAM: CT HEAD WITHOUT CONTRAST CT MAXILLOFACIAL WITHOUT CONTRAST CT CERVICAL SPINE WITHOUT CONTRAST TECHNIQUE: Multidetector CT imaging of the head, cervical spine, and maxillofacial structures were performed using the standard protocol without intravenous contrast. Multiplanar CT image reconstructions of the cervical spine and maxillofacial structures were also generated. RADIATION DOSE REDUCTION: This exam was performed according to the departmental dose-optimization program which includes automated exposure control, adjustment of the mA and/or kV according to patient size and/or use of iterative reconstruction technique. COMPARISON:  None Available. FINDINGS: CT HEAD FINDINGS Brain: Patchy and confluent areas of decreased  attenuation are noted throughout the deep and periventricular white matter of the cerebral hemispheres bilaterally, compatible with chronic microvascular ischemic disease. Right frontal encephalomalacia. No evidence of large-territorial acute infarction. No parenchymal hemorrhage. No mass lesion. No extra-axial collection. No mass effect or midline shift. No hydrocephalus. Basilar cisterns are patent. Vascular: No hyperdense vessel. Skull: No acute fracture or focal lesion. Other: Left frontal 7 mm hematoma. CT MAXILLOFACIAL FINDINGS Osseous: No fracture or mandibular dislocation. No destructive process. Patient is edentulous. Sinuses/Orbits: Almost complete opacification of left maxillary sinus with hypertrophy of the sinus walls consistent with chronic sinusitis. Question polypoid extension of the maxillary sinus mucosal thickening along the left nasal cavity. Polypoid like mucosal thickening of the right sphenoid and ethmoid sinuses. Paranasal sinuses and mastoid air cells are clear. The orbits are unremarkable. Soft tissues: Negative. CT CERVICAL SPINE FINDINGS Alignment: Normal. Skull base and vertebrae: Multilevel severe degenerative changes of the spine. Associated multilevel moderate to severe osseous neural foraminal stenosis. No severe osseous central canal stenosis. No acute fracture. No aggressive appearing focal osseous lesion or focal pathologic process. Soft tissues and spinal canal: No prevertebral fluid or swelling. No visible canal hematoma. Upper chest: 1.4 x 0.8 cm left apical pulmonary nodule. Associated thin walled cystic lesion. Other: Subacute to chronic right posterior second rib fracture. Atherosclerotic plaque of the carotid arteries within the neck. IMPRESSION: 1. No acute intracranial abnormality. 2.  No acute displaced facial fracture. 3. No acute displaced fracture or traumatic listhesis of the cervical spine. 4. Chronic left maxillary sinusitis with query polypoid-like extension into  the left nasal cavity-correlate with direct visualization. 5. A 1.4 x 0.8 cm left apical pulmonary nodule. Associated thin walled cystic lesion. Consider one of the following in 3 months for both low-risk and high-risk individuals: (a) repeat chest CT, (b) follow-up PET-CT, or (c) tissue sampling. This recommendation follows the consensus statement: Guidelines for Management of Incidental Pulmonary Nodules Detected on CT Images: From the Fleischner Society 2017; Radiology 2017; 284:228-243. Electronically Signed   By: Morgane  Naveau M.D.   On: 10/17/2023 01:03   CT MAXILLOFACIAL WO CONTRAST Result Date: 10/17/2023 CLINICAL DATA:  Neck trauma (Age >= 65y); Head trauma, minor (Age >= 65y); Facial trauma, blunt Pt arrived POV with family for fall after stumbling over an object. Pt fell face forward from standing position and hit his face on hardwood floors. No LOC, EXAM: CT HEAD WITHOUT CONTRAST CT MAXILLOFACIAL WITHOUT CONTRAST CT CERVICAL SPINE WITHOUT CONTRAST TECHNIQUE: Multidetector CT imaging of the head, cervical spine, and maxillofacial structures were performed using the standard protocol without intravenous contrast. Multiplanar CT image reconstructions of the cervical spine and maxillofacial structures were also generated. RADIATION DOSE REDUCTION: This exam was performed according to the departmental dose-optimization program which includes automated exposure control, adjustment of the mA and/or kV according to patient size and/or use of iterative  reconstruction technique. COMPARISON:  None Available. FINDINGS: CT HEAD FINDINGS Brain: Patchy and confluent areas of decreased attenuation are noted throughout the deep and periventricular white matter of the cerebral hemispheres bilaterally, compatible with chronic microvascular ischemic disease. Right frontal encephalomalacia. No evidence of large-territorial acute infarction. No parenchymal hemorrhage. No mass lesion. No extra-axial collection. No mass  effect or midline shift. No hydrocephalus. Basilar cisterns are patent. Vascular: No hyperdense vessel. Skull: No acute fracture or focal lesion. Other: Left frontal 7 mm hematoma. CT MAXILLOFACIAL FINDINGS Osseous: No fracture or mandibular dislocation. No destructive process. Patient is edentulous. Sinuses/Orbits: Almost complete opacification of left maxillary sinus with hypertrophy of the sinus walls consistent with chronic sinusitis. Question polypoid extension of the maxillary sinus mucosal thickening along the left nasal cavity. Polypoid like mucosal thickening of the right sphenoid and ethmoid sinuses. Paranasal sinuses and mastoid air cells are clear. The orbits are unremarkable. Soft tissues: Negative. CT CERVICAL SPINE FINDINGS Alignment: Normal. Skull base and vertebrae: Multilevel severe degenerative changes of the spine. Associated multilevel moderate to severe osseous neural foraminal stenosis. No severe osseous central canal stenosis. No acute fracture. No aggressive appearing focal osseous lesion or focal pathologic process. Soft tissues and spinal canal: No prevertebral fluid or swelling. No visible canal hematoma. Upper chest: 1.4 x 0.8 cm left apical pulmonary nodule. Associated thin walled cystic lesion. Other: Subacute to chronic right posterior second rib fracture. Atherosclerotic plaque of the carotid arteries within the neck. IMPRESSION: 1. No acute intracranial abnormality. 2.  No acute displaced facial fracture. 3. No acute displaced fracture or traumatic listhesis of the cervical spine. 4. Chronic left maxillary sinusitis with query polypoid-like extension into the left nasal cavity-correlate with direct visualization. 5. A 1.4 x 0.8 cm left apical pulmonary nodule. Associated thin walled cystic lesion. Consider one of the following in 3 months for both low-risk and high-risk individuals: (a) repeat chest CT, (b) follow-up PET-CT, or (c) tissue sampling. This recommendation follows the  consensus statement: Guidelines for Management of Incidental Pulmonary Nodules Detected on CT Images: From the Fleischner Society 2017; Radiology 2017; 284:228-243. Electronically Signed   By: Morgane  Naveau M.D.   On: 10/17/2023 01:03    Procedures .Laceration Repair  Date/Time: 10/17/2023 1:56 AM  Performed by: Haze Lonni PARAS, MD Authorized by: Haze Lonni PARAS, MD   Consent:    Consent obtained:  Verbal   Consent given by:  Patient   Risks, benefits, and alternatives were discussed: yes     Risks discussed:  Infection, pain, retained foreign body and poor cosmetic result Universal protocol:    Procedure explained and questions answered to patient or proxy's satisfaction: yes     Relevant documents present and verified: yes     Test results available: yes     Imaging studies available: yes     Required blood products, implants, devices, and special equipment available: yes     Site/side marked: yes     Immediately prior to procedure, a time out was called: yes     Patient identity confirmed:  Verbally with patient Anesthesia:    Anesthesia method:  None Laceration details:    Location:  Face   Face location:  R eyebrow   Length (cm):  1.5 Pre-procedure details:    Preparation:  Patient was prepped and draped in usual sterile fashion and imaging obtained to evaluate for foreign bodies Treatment:    Area cleansed with:  Chlorhexidine   Amount of cleaning:  Standard   Irrigation solution:  Sterile water   Irrigation method:  Syringe Skin repair:    Repair method:  Tissue adhesive Approximation:    Approximation:  Close Repair type:    Repair type:  Simple Post-procedure details:    Dressing:  Open (no dressing)   Procedure completion:  Tolerated well, no immediate complications     Medications Ordered in ED Medications - No data to display  ED Course/ Medical Decision Making/ A&P                                 Medical Decision Making Amount and/or  Complexity of Data Reviewed Radiology: ordered.   Patient with laceration in right eyebrow, abrasion to right cheek.  Normal extraocular muscle movement, normal eye exam.  Normal vision.  CT head, maxillofacial bones, cervical spine negative for acute injury.        Final Clinical Impression(s) / ED Diagnoses Final diagnoses:  Contusion of face, initial encounter    Rx / DC Orders ED Discharge Orders     None         Elijan Googe, Lonni PARAS, MD 10/17/23 [phone]

## 2023-10-17 NOTE — ED Notes (Signed)
 Patient verbalizes understanding of discharge instructions. Opportunity for questioning and answers were provided. Armband removed by staff, pt discharged from ED. Wheeled out to car, son to drive home

## 2023-10-24 ENCOUNTER — Telehealth: Payer: Self-pay

## 2023-10-24 NOTE — Progress Notes (Signed)
 Transition Care Management Unsuccessful Follow-up Telephone Call  Date of discharge and from where:  10/17/2023 [facility]  Attempts:  2nd Attempt  Reason for unsuccessful TCM follow-up call:  Unable to leave message  Izel Eisenhardt [facility]  [facility] Guide Direct Dial: [phone]  Website: [email or web address]

## 2023-10-24 NOTE — Progress Notes (Signed)
 Transition Care Management Unsuccessful Follow-up Telephone Call  Date of discharge and from where:  10/17/2023 [facility]  Attempts:  1st Attempt  Reason for unsuccessful TCM follow-up call:  Unable to leave message  Jaspreet Bodner [facility]  [facility] Guide Direct Dial: [phone]  Website: [email or web address]

## 2024-02-12 DIAGNOSIS — Z993 Dependence on wheelchair: Secondary | ICD-10-CM | POA: Diagnosis not present

## 2024-02-12 DIAGNOSIS — E876 Hypokalemia: Secondary | ICD-10-CM | POA: Diagnosis not present

## 2024-02-12 DIAGNOSIS — E860 Dehydration: Secondary | ICD-10-CM | POA: Diagnosis not present

## 2024-02-12 DIAGNOSIS — I739 Peripheral vascular disease, unspecified: Secondary | ICD-10-CM | POA: Diagnosis not present

## 2024-02-12 DIAGNOSIS — I10 Essential (primary) hypertension: Secondary | ICD-10-CM | POA: Diagnosis not present

## 2024-02-12 DIAGNOSIS — I160 Hypertensive urgency: Secondary | ICD-10-CM | POA: Diagnosis not present

## 2024-02-12 DIAGNOSIS — D649 Anemia, unspecified: Secondary | ICD-10-CM | POA: Diagnosis not present

## 2024-02-12 DIAGNOSIS — Z79899 Other long term (current) drug therapy: Secondary | ICD-10-CM | POA: Diagnosis not present

## 2024-02-12 DIAGNOSIS — Z7401 Bed confinement status: Secondary | ICD-10-CM | POA: Diagnosis not present

## 2024-02-12 DIAGNOSIS — I70203 Unspecified atherosclerosis of native arteries of extremities, bilateral legs: Secondary | ICD-10-CM | POA: Diagnosis not present

## 2024-02-12 DIAGNOSIS — R531 Weakness: Secondary | ICD-10-CM | POA: Diagnosis not present

## 2024-02-13 DIAGNOSIS — I70202 Unspecified atherosclerosis of native arteries of extremities, left leg: Secondary | ICD-10-CM | POA: Diagnosis not present

## 2024-02-13 DIAGNOSIS — I160 Hypertensive urgency: Secondary | ICD-10-CM | POA: Diagnosis not present

## 2024-02-14 DIAGNOSIS — I160 Hypertensive urgency: Secondary | ICD-10-CM | POA: Diagnosis not present

## 2024-02-15 DIAGNOSIS — I160 Hypertensive urgency: Secondary | ICD-10-CM | POA: Diagnosis not present

## 2024-02-16 DIAGNOSIS — I160 Hypertensive urgency: Secondary | ICD-10-CM | POA: Diagnosis not present

## 2024-02-17 DIAGNOSIS — I160 Hypertensive urgency: Secondary | ICD-10-CM | POA: Diagnosis not present

## 2024-02-18 DIAGNOSIS — Z7401 Bed confinement status: Secondary | ICD-10-CM | POA: Diagnosis not present

## 2024-02-18 DIAGNOSIS — D649 Anemia, unspecified: Secondary | ICD-10-CM | POA: Diagnosis not present

## 2024-02-18 DIAGNOSIS — Z993 Dependence on wheelchair: Secondary | ICD-10-CM | POA: Diagnosis not present

## 2024-02-18 DIAGNOSIS — R278 Other lack of coordination: Secondary | ICD-10-CM | POA: Diagnosis not present

## 2024-02-18 DIAGNOSIS — I10 Essential (primary) hypertension: Secondary | ICD-10-CM | POA: Diagnosis not present

## 2024-02-18 DIAGNOSIS — E785 Hyperlipidemia, unspecified: Secondary | ICD-10-CM | POA: Diagnosis not present

## 2024-02-18 DIAGNOSIS — I739 Peripheral vascular disease, unspecified: Secondary | ICD-10-CM | POA: Diagnosis not present

## 2024-02-18 DIAGNOSIS — Z743 Need for continuous supervision: Secondary | ICD-10-CM | POA: Diagnosis not present

## 2024-02-18 DIAGNOSIS — R531 Weakness: Secondary | ICD-10-CM | POA: Diagnosis not present

## 2024-02-18 DIAGNOSIS — I160 Hypertensive urgency: Secondary | ICD-10-CM | POA: Diagnosis not present

## 2024-02-18 DIAGNOSIS — M6281 Muscle weakness (generalized): Secondary | ICD-10-CM | POA: Diagnosis not present

## 2024-02-20 DIAGNOSIS — R531 Weakness: Secondary | ICD-10-CM | POA: Diagnosis not present

## 2024-02-20 DIAGNOSIS — I10 Essential (primary) hypertension: Secondary | ICD-10-CM | POA: Diagnosis not present

## 2024-02-20 DIAGNOSIS — E785 Hyperlipidemia, unspecified: Secondary | ICD-10-CM | POA: Diagnosis not present

## 2024-02-20 DIAGNOSIS — I739 Peripheral vascular disease, unspecified: Secondary | ICD-10-CM | POA: Diagnosis not present

## 2024-02-23 DIAGNOSIS — E785 Hyperlipidemia, unspecified: Secondary | ICD-10-CM | POA: Diagnosis not present

## 2024-02-23 DIAGNOSIS — I10 Essential (primary) hypertension: Secondary | ICD-10-CM | POA: Diagnosis not present

## 2024-02-23 DIAGNOSIS — R531 Weakness: Secondary | ICD-10-CM | POA: Diagnosis not present

## 2024-02-25 DIAGNOSIS — I10 Essential (primary) hypertension: Secondary | ICD-10-CM | POA: Diagnosis not present

## 2024-02-25 DIAGNOSIS — E785 Hyperlipidemia, unspecified: Secondary | ICD-10-CM | POA: Diagnosis not present

## 2024-02-25 DIAGNOSIS — R531 Weakness: Secondary | ICD-10-CM | POA: Diagnosis not present

## 2024-02-26 DIAGNOSIS — D649 Anemia, unspecified: Secondary | ICD-10-CM | POA: Diagnosis not present

## 2024-02-26 DIAGNOSIS — R531 Weakness: Secondary | ICD-10-CM | POA: Diagnosis not present

## 2024-02-26 DIAGNOSIS — I739 Peripheral vascular disease, unspecified: Secondary | ICD-10-CM | POA: Diagnosis not present

## 2024-02-26 DIAGNOSIS — E785 Hyperlipidemia, unspecified: Secondary | ICD-10-CM | POA: Diagnosis not present

## 2024-02-26 DIAGNOSIS — Z993 Dependence on wheelchair: Secondary | ICD-10-CM | POA: Diagnosis not present

## 2024-02-26 DIAGNOSIS — I160 Hypertensive urgency: Secondary | ICD-10-CM | POA: Diagnosis not present

## 2024-03-02 DIAGNOSIS — I160 Hypertensive urgency: Secondary | ICD-10-CM | POA: Diagnosis not present

## 2024-03-03 DIAGNOSIS — I160 Hypertensive urgency: Secondary | ICD-10-CM | POA: Diagnosis not present

## 2024-03-22 DIAGNOSIS — E559 Vitamin D deficiency, unspecified: Secondary | ICD-10-CM | POA: Diagnosis not present

## 2024-03-22 DIAGNOSIS — R609 Edema, unspecified: Secondary | ICD-10-CM | POA: Diagnosis not present

## 2024-03-22 DIAGNOSIS — R410 Disorientation, unspecified: Secondary | ICD-10-CM | POA: Diagnosis not present

## 2024-03-22 DIAGNOSIS — I10 Essential (primary) hypertension: Secondary | ICD-10-CM | POA: Diagnosis not present

## 2024-03-22 DIAGNOSIS — M130 Polyarthritis, unspecified: Secondary | ICD-10-CM | POA: Diagnosis not present

## 2024-03-22 DIAGNOSIS — Z6822 Body mass index (BMI) 22.0-22.9, adult: Secondary | ICD-10-CM | POA: Diagnosis not present

## 2024-05-06 DIAGNOSIS — I7789 Other specified disorders of arteries and arterioles: Secondary | ICD-10-CM | POA: Diagnosis not present

## 2024-05-06 DIAGNOSIS — I70202 Unspecified atherosclerosis of native arteries of extremities, left leg: Secondary | ICD-10-CM | POA: Diagnosis not present
